# Patient Record
Sex: Female | Born: 1950 | Race: Black or African American | Hispanic: No | Marital: Married | Smoking: Never smoker
Health system: Southern US, Community
[De-identification: ages and names within clinical notes are randomized; demographics above are authoritative.]

## PROBLEM LIST (undated history)

## (undated) DIAGNOSIS — M797 Fibromyalgia: Secondary | ICD-10-CM

## (undated) DIAGNOSIS — R112 Nausea with vomiting, unspecified: Secondary | ICD-10-CM

## (undated) DIAGNOSIS — M1990 Unspecified osteoarthritis, unspecified site: Secondary | ICD-10-CM

## (undated) DIAGNOSIS — I471 Supraventricular tachycardia, unspecified: Secondary | ICD-10-CM

## (undated) DIAGNOSIS — Z8489 Family history of other specified conditions: Secondary | ICD-10-CM

## (undated) DIAGNOSIS — K219 Gastro-esophageal reflux disease without esophagitis: Secondary | ICD-10-CM

## (undated) DIAGNOSIS — I10 Essential (primary) hypertension: Secondary | ICD-10-CM

## (undated) DIAGNOSIS — C50919 Malignant neoplasm of unspecified site of unspecified female breast: Secondary | ICD-10-CM

## (undated) DIAGNOSIS — J329 Chronic sinusitis, unspecified: Secondary | ICD-10-CM

## (undated) DIAGNOSIS — R42 Dizziness and giddiness: Secondary | ICD-10-CM

## (undated) DIAGNOSIS — M810 Age-related osteoporosis without current pathological fracture: Secondary | ICD-10-CM

## (undated) DIAGNOSIS — R002 Palpitations: Secondary | ICD-10-CM

## (undated) DIAGNOSIS — G4700 Insomnia, unspecified: Secondary | ICD-10-CM

## (undated) DIAGNOSIS — T753XXA Motion sickness, initial encounter: Secondary | ICD-10-CM

## (undated) DIAGNOSIS — Z9889 Other specified postprocedural states: Secondary | ICD-10-CM

## (undated) DIAGNOSIS — I7300 Raynaud's syndrome without gangrene: Secondary | ICD-10-CM

## (undated) DIAGNOSIS — I491 Atrial premature depolarization: Secondary | ICD-10-CM

## (undated) HISTORY — PX: APPENDECTOMY: SHX54

## (undated) HISTORY — PX: BLADDER SURGERY: SHX569

## (undated) HISTORY — PX: ESOPHAGOGASTRODUODENOSCOPY: SHX1529

## (undated) HISTORY — PX: COLON SURGERY: SHX602

---

## 2012-06-05 DIAGNOSIS — E559 Vitamin D deficiency, unspecified: Secondary | ICD-10-CM | POA: Insufficient documentation

## 2012-07-20 DIAGNOSIS — I7300 Raynaud's syndrome without gangrene: Secondary | ICD-10-CM | POA: Insufficient documentation

## 2012-10-11 HISTORY — PX: MASTECTOMY, PARTIAL: SHX709

## 2012-10-15 DIAGNOSIS — C50812 Malignant neoplasm of overlapping sites of left female breast: Secondary | ICD-10-CM | POA: Insufficient documentation

## 2013-10-30 DIAGNOSIS — M7750 Other enthesopathy of unspecified foot: Secondary | ICD-10-CM | POA: Insufficient documentation

## 2013-10-30 DIAGNOSIS — M722 Plantar fascial fibromatosis: Secondary | ICD-10-CM | POA: Insufficient documentation

## 2013-10-30 DIAGNOSIS — M79673 Pain in unspecified foot: Secondary | ICD-10-CM | POA: Insufficient documentation

## 2014-05-22 DIAGNOSIS — R140 Abdominal distension (gaseous): Secondary | ICD-10-CM | POA: Insufficient documentation

## 2014-05-22 DIAGNOSIS — K219 Gastro-esophageal reflux disease without esophagitis: Secondary | ICD-10-CM | POA: Insufficient documentation

## 2014-08-09 DIAGNOSIS — H52209 Unspecified astigmatism, unspecified eye: Secondary | ICD-10-CM

## 2014-08-09 DIAGNOSIS — H524 Presbyopia: Secondary | ICD-10-CM

## 2014-08-09 DIAGNOSIS — H5200 Hypermetropia, unspecified eye: Secondary | ICD-10-CM | POA: Insufficient documentation

## 2014-08-09 DIAGNOSIS — H04123 Dry eye syndrome of bilateral lacrimal glands: Secondary | ICD-10-CM | POA: Insufficient documentation

## 2014-12-18 DIAGNOSIS — R9431 Abnormal electrocardiogram [ECG] [EKG]: Secondary | ICD-10-CM | POA: Insufficient documentation

## 2015-06-23 DIAGNOSIS — G43809 Other migraine, not intractable, without status migrainosus: Secondary | ICD-10-CM | POA: Insufficient documentation

## 2015-12-27 DIAGNOSIS — M810 Age-related osteoporosis without current pathological fracture: Secondary | ICD-10-CM | POA: Insufficient documentation

## 2016-02-12 DIAGNOSIS — M6780 Other specified disorders of synovium and tendon, unspecified site: Secondary | ICD-10-CM | POA: Insufficient documentation

## 2016-12-13 DIAGNOSIS — M6530 Trigger finger, unspecified finger: Secondary | ICD-10-CM | POA: Insufficient documentation

## 2017-08-01 DIAGNOSIS — Z9049 Acquired absence of other specified parts of digestive tract: Secondary | ICD-10-CM | POA: Insufficient documentation

## 2017-12-08 DIAGNOSIS — R232 Flushing: Secondary | ICD-10-CM | POA: Insufficient documentation

## 2017-12-08 DIAGNOSIS — Z800 Family history of malignant neoplasm of digestive organs: Secondary | ICD-10-CM | POA: Insufficient documentation

## 2017-12-08 DIAGNOSIS — Z803 Family history of malignant neoplasm of breast: Secondary | ICD-10-CM | POA: Insufficient documentation

## 2017-12-08 DIAGNOSIS — Z79810 Long term (current) use of selective estrogen receptor modulators (SERMs): Secondary | ICD-10-CM

## 2017-12-08 DIAGNOSIS — Z5181 Encounter for therapeutic drug level monitoring: Secondary | ICD-10-CM | POA: Insufficient documentation

## 2018-01-16 DIAGNOSIS — C50812 Malignant neoplasm of overlapping sites of left female breast: Secondary | ICD-10-CM | POA: Insufficient documentation

## 2018-03-02 DIAGNOSIS — Z1379 Encounter for other screening for genetic and chromosomal anomalies: Secondary | ICD-10-CM | POA: Insufficient documentation

## 2018-11-17 DIAGNOSIS — M19011 Primary osteoarthritis, right shoulder: Secondary | ICD-10-CM | POA: Insufficient documentation

## 2019-01-04 ENCOUNTER — Encounter: Payer: Self-pay | Admitting: Podiatry

## 2019-01-04 ENCOUNTER — Ambulatory Visit (INDEPENDENT_AMBULATORY_CARE_PROVIDER_SITE_OTHER): Payer: Medicare Other | Admitting: Podiatry

## 2019-01-04 ENCOUNTER — Other Ambulatory Visit: Payer: Self-pay

## 2019-01-04 VITALS — BP 113/72 | HR 69 | Resp 16

## 2019-01-04 DIAGNOSIS — G4700 Insomnia, unspecified: Secondary | ICD-10-CM | POA: Insufficient documentation

## 2019-01-04 DIAGNOSIS — R002 Palpitations: Secondary | ICD-10-CM | POA: Insufficient documentation

## 2019-01-04 DIAGNOSIS — J329 Chronic sinusitis, unspecified: Secondary | ICD-10-CM | POA: Insufficient documentation

## 2019-01-04 DIAGNOSIS — M545 Low back pain, unspecified: Secondary | ICD-10-CM | POA: Insufficient documentation

## 2019-01-04 DIAGNOSIS — L600 Ingrowing nail: Secondary | ICD-10-CM

## 2019-01-04 DIAGNOSIS — J309 Allergic rhinitis, unspecified: Secondary | ICD-10-CM | POA: Insufficient documentation

## 2019-01-04 DIAGNOSIS — K649 Unspecified hemorrhoids: Secondary | ICD-10-CM | POA: Insufficient documentation

## 2019-01-04 DIAGNOSIS — G4730 Sleep apnea, unspecified: Secondary | ICD-10-CM | POA: Insufficient documentation

## 2019-01-04 DIAGNOSIS — M818 Other osteoporosis without current pathological fracture: Secondary | ICD-10-CM | POA: Insufficient documentation

## 2019-01-04 DIAGNOSIS — Z780 Asymptomatic menopausal state: Secondary | ICD-10-CM | POA: Insufficient documentation

## 2019-01-04 MED ORDER — NEOMYCIN-POLYMYXIN-HC 1 % OT SOLN: OTIC | 1 refills | Status: DC

## 2019-01-04 NOTE — Patient Instructions (Signed)

## 2019-01-04 NOTE — Progress Notes (Signed)
Subjective:  Patient ID: Jackie Carlson, female    DOB: 1951-06-28,  MRN: [id number] HPI Chief Complaint  Patient presents with  . Toe Pain    Hallux left - medial border, tender, red, slightly swollen x weeks, tried trimming out, said had nail removed before  . Foot Pain    Suggestions for cramping in feet - tried increasing magnesium    68 y.o. female presents with the above complaint.   ROS: Denies fever chills nausea vomiting muscle aches pains calf pain back pain chest pain shortness of breath and headache.    No past medical history on file.   Current Outpatient Medications:  .  Ascorbic Acid (VITAMIN C PO), Take by mouth., Disp: , Rfl:  .  Multiple Vitamin (MULTIVITAMIN) capsule, Take 1 capsule by mouth daily., Disp: , Rfl:  .  Multiple Vitamins-Minerals (OSTEOPRIME PLUS PO), Take by mouth., Disp: , Rfl:  .  Omega-3 Fatty Acids (FISH OIL PO), Take by mouth., Disp: , Rfl:  .  risedronate (ACTONEL) 150 MG tablet, TAKE 1 TABLET EVERY 30 DAYS. TAKE WITH A FULL GLASS OF WATER. DO NOT LIE DOWN FOR THE NEXT 30 MINUTES., Disp: , Rfl:  .  VITAMIN D PO, Take by mouth., Disp: , Rfl:  .  fluticasone (FLONASE) 50 MCG/ACT nasal spray, , Disp: , Rfl:  .  gabapentin (NEURONTIN) 300 MG capsule, , Disp: , Rfl:  .  meclizine (ANTIVERT) 25 MG tablet, , Disp: , Rfl:  .  methocarbamol (ROBAXIN) 750 MG tablet, , Disp: , Rfl:  .  naproxen (NAPROSYN) 500 MG tablet, TAKE 1 TABLET BY MOUTH TWICE DAILY AS NEEDED WITH FOOD, Disp: , Rfl:  .  NEOMYCIN-POLYMYXIN-HYDROCORTISONE (CORTISPORIN) 1 % SOLN OTIC solution, Apply 1-2 drops to toe BID after soaking, Disp: 10 mL, Rfl: 1 .  olopatadine (PATANOL) 0.1 % ophthalmic solution, , Disp: , Rfl:  .  promethazine (PHENERGAN) 25 MG tablet, , Disp: , Rfl:  .  tamoxifen (NOLVADEX) 20 MG tablet, , Disp: , Rfl:  .  valACYclovir (VALTREX) 500 MG tablet, , Disp: , Rfl:  .  venlafaxine XR (EFFEXOR-XR) 37.5 MG 24 hr capsule, , Disp: , Rfl:   Allergies  Allergen Reactions   . Nalbuphine Nausea Only    Other reaction(s): Dizziness, Vomiting  . Pentazocine Nausea Only    Other reaction(s): Dizziness, Vomiting  . Sulfa Antibiotics Hives  . Codeine Nausea Only  . Tegaderm Ag Mesh [Silver] Dermatitis    Can use IV 3000 opsite dressing  . Pregabalin Swelling    Foot swelling bilaterally.   Review of Systems Objective:   Vitals:   01/04/19 1003  BP: 113/72  Pulse: 69  Resp: 16    General: Well developed, nourished, in no acute distress, alert and oriented x3   Dermatological: Skin is warm, dry and supple bilateral. Nails x 10 are well maintained; remaining integument appears unremarkable at this time. There are no open sores, no preulcerative lesions, no rash or signs of infection present.  Sharp incurvated nail margins along the tibiofibular border of the hallux left.  Mild discoloration of the nail appears to be chronic in nature more of an irritation than an actual infection.   Vascular: Dorsalis Pedis artery and Posterior Tibial artery pedal pulses are 2/4 bilateral with immedate capillary fill time. Pedal hair growth present. No varicosities and no lower extremity edema present bilateral.   Neruologic: Grossly intact via light touch bilateral. Vibratory intact via tuning fork bilateral. Protective threshold with Jobie Quaker  monofilament intact to all pedal sites bilateral. Patellar and Achilles deep tendon reflexes 2+ bilateral. No Babinski or clonus noted bilateral.   Musculoskeletal: No gross boney pedal deformities bilateral. No pain, crepitus, or limitation noted with foot and ankle range of motion bilateral. Muscular strength 5/5 in all groups tested bilateral.  Gait: Unassisted, Nonantalgic.    Radiographs:  None taken  Assessment & Plan:   Assessment: Ingrown toenail tibial and fibular border of the hallux left  Plan: Discussed etiology pathology conservative versus surgical therapies.  At this point a chemical matrixectomy was  performed to the tibiofibular border of the hallux left.  There were no complications she tolerated this well after local anesthetic was administered.  She was provided with both oral and written home-going instruction for the care and soaking of the toe as well as a prescription for Corticosporin otic to be applied twice daily after soaking.  She will follow-up with me as needed.      T. [place], [place]

## 2020-11-19 ENCOUNTER — Ambulatory Visit: Payer: Medicare Other | Admitting: Podiatry

## 2021-06-16 ENCOUNTER — Other Ambulatory Visit: Payer: Self-pay | Admitting: Physician Assistant

## 2021-06-16 DIAGNOSIS — H93A3 Pulsatile tinnitus, bilateral: Secondary | ICD-10-CM

## 2021-06-24 ENCOUNTER — Other Ambulatory Visit: Payer: Self-pay

## 2021-06-24 ENCOUNTER — Ambulatory Visit
Admission: RE | Admit: 2021-06-24 | Discharge: 2021-06-24 | Disposition: A | Discharge status: Home / Self Care | Payer: Medicare Other | Source: Ambulatory Visit | Attending: Physician Assistant | Admitting: Physician Assistant

## 2021-06-24 DIAGNOSIS — H93A3 Pulsatile tinnitus, bilateral: Secondary | ICD-10-CM | POA: Insufficient documentation

## 2021-06-24 IMAGING — MR MR MRA HEAD W/O CM
1 series · 19 of 48 positions shown · IV contrast (gadavist)
Comparison: No pertinent prior exam.

CLINICAL DATA: Pulsatile tinnitus, bilateral. Additional history
provided by scanning technologist: Patient reports "whooshing noise"
when turning head for 2 months, intermittent nausea and headaches.

EXAM:
MRI HEAD WITHOUT AND WITH CONTRAST
MRA HEAD WITHOUT CONTRAST
TECHNIQUE: Multiplanar, multi-echo pulse sequences of the brain and surrounding
structures were acquired without and with intravenous contrast.
Angiographic images of the Circle of Willis were acquired using MRA
technique without intravenous contrast.
CONTRAST:  9mL GADAVIST GADOBUTROL 1 MMOL/ML IV SOLN

[Series 9: TOF · axial · 0.5mm · 0.41mm/px · z∈[-82,+15]mm · 19 of 205 slices shown]
[im 1/205]
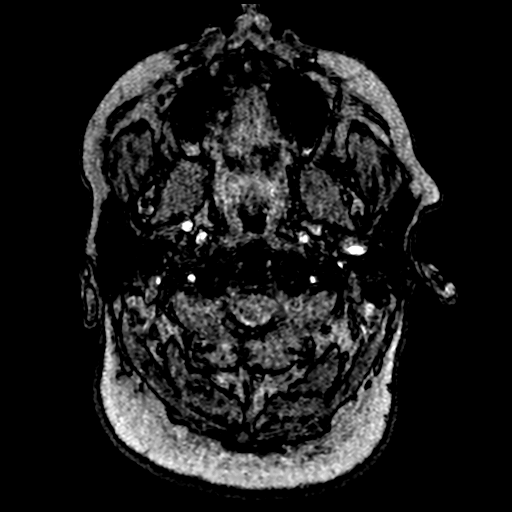
[im 5/205]
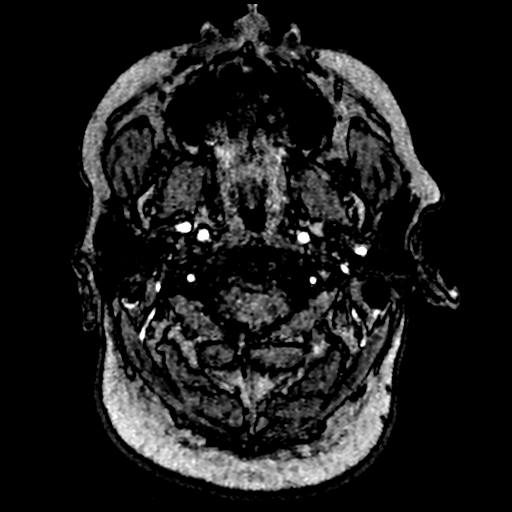
[im 9/205]
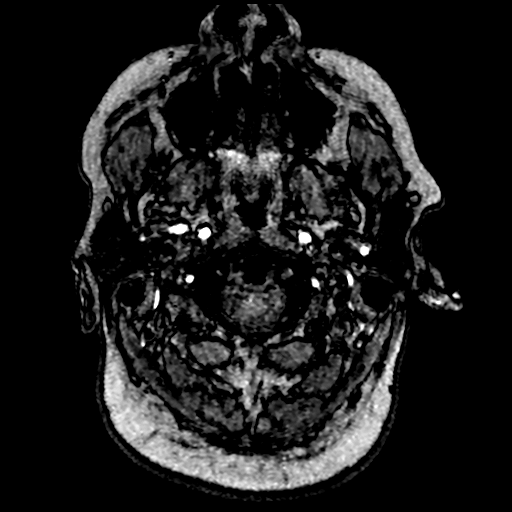
[im 14/205]
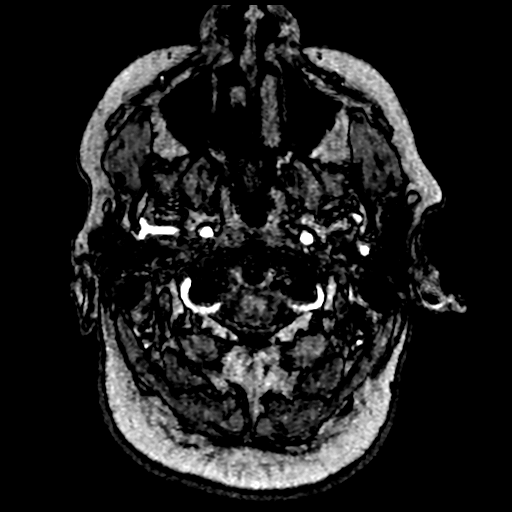
[im 18/205]
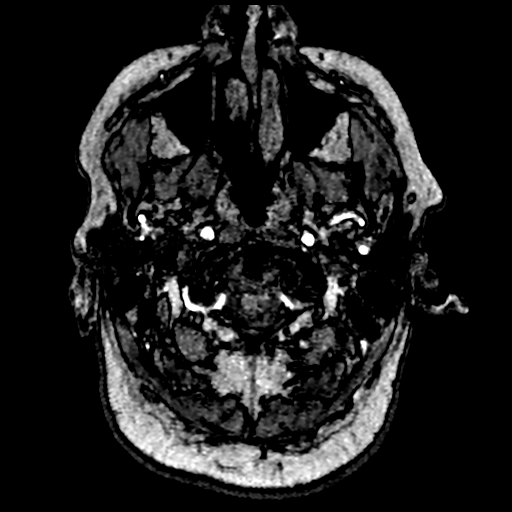
[im 22/205]
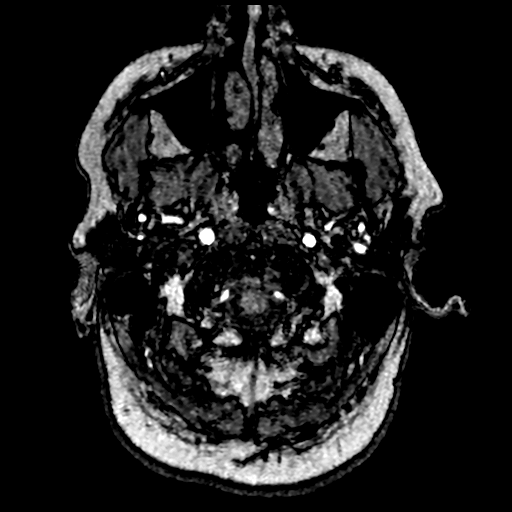
[im 27/205]
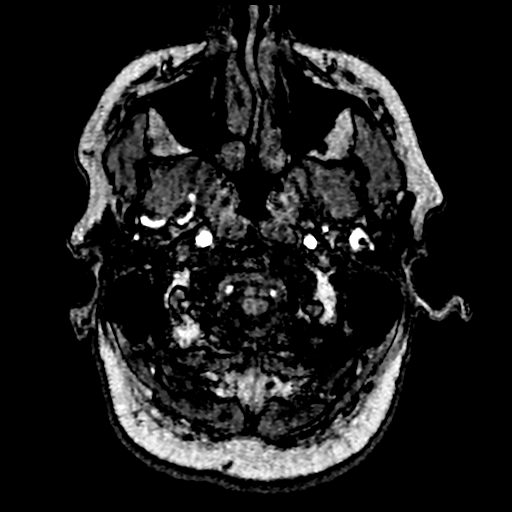
[im 31/205]
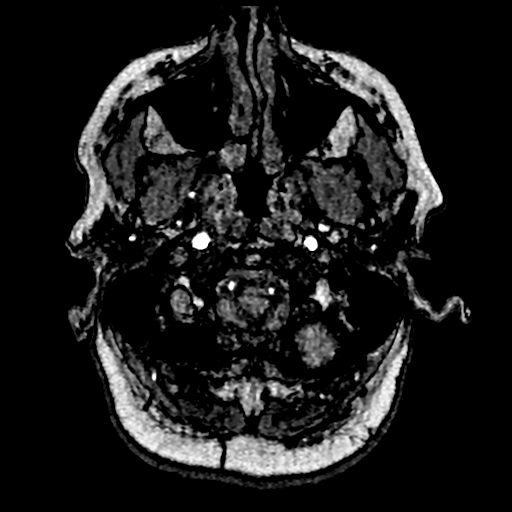
[im 35/205]
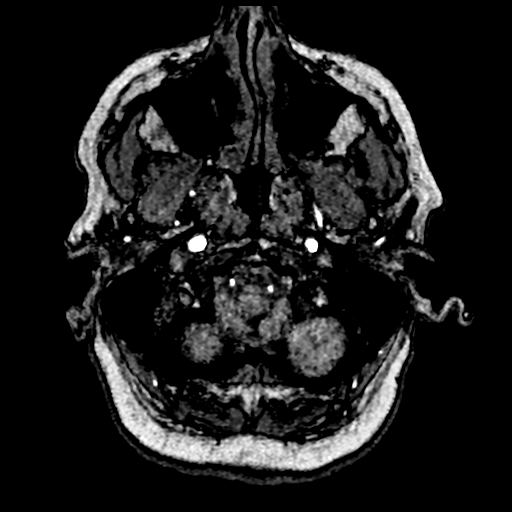
[im 40/205]
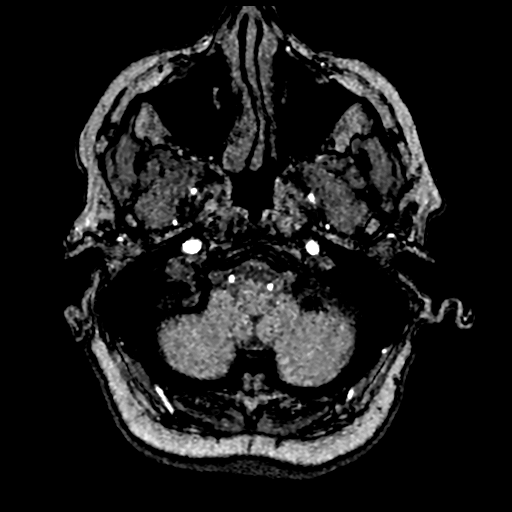
[im 44/205]
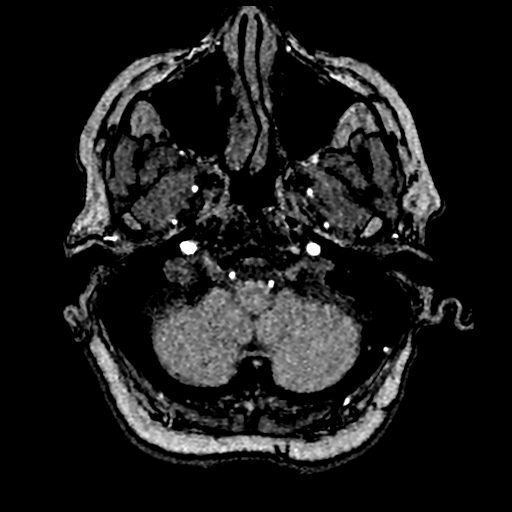
[im 66/205]
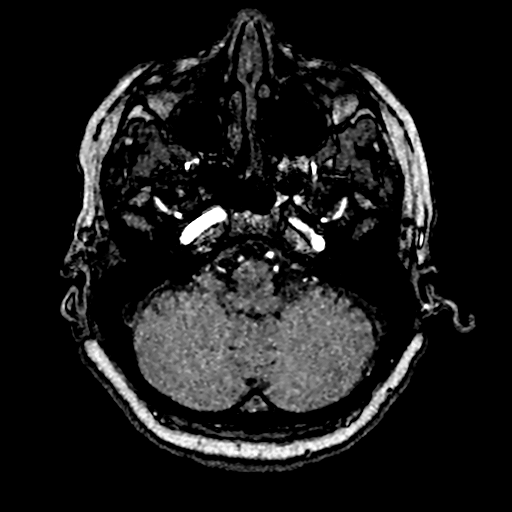
[im 92/205]
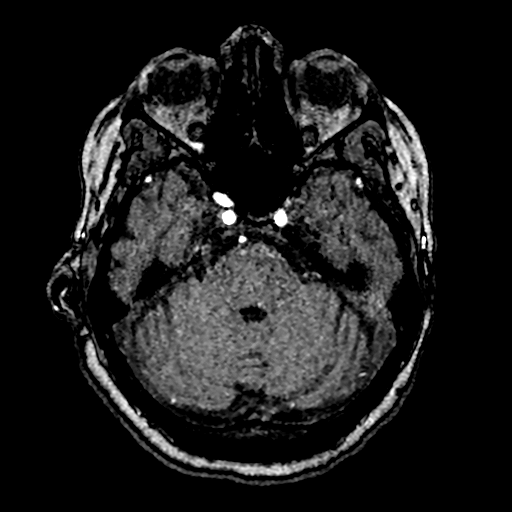
[im 105/205]
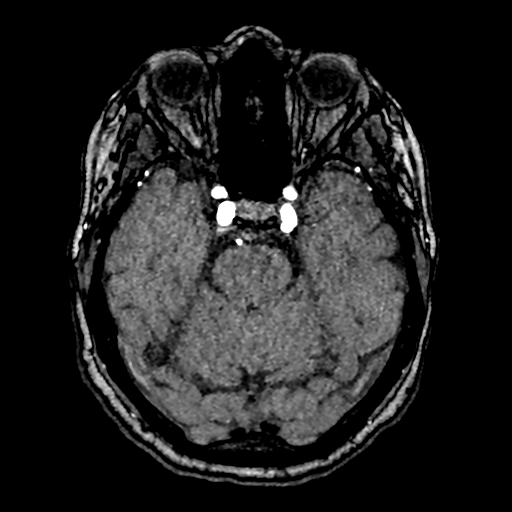
[im 118/205]
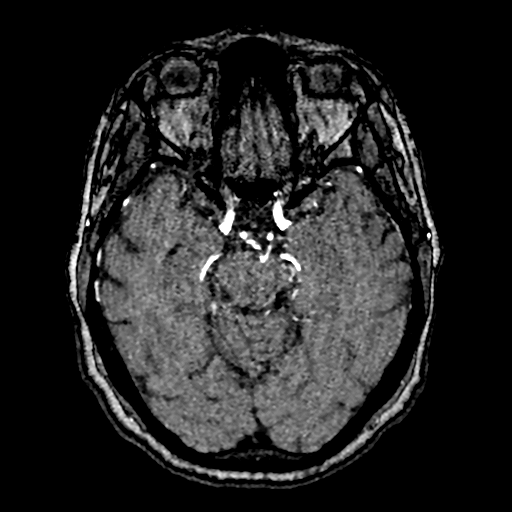
[im 144/205]
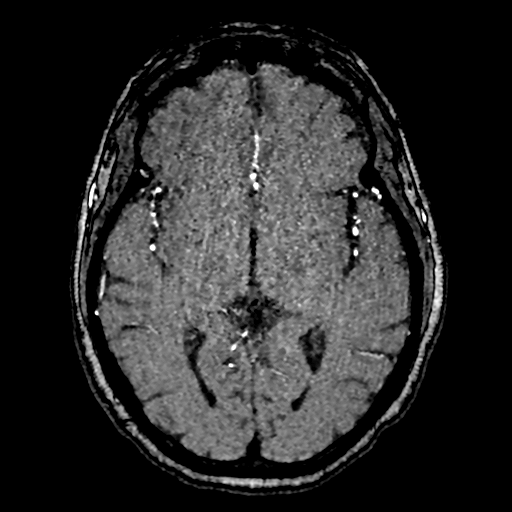
[im 170/205]
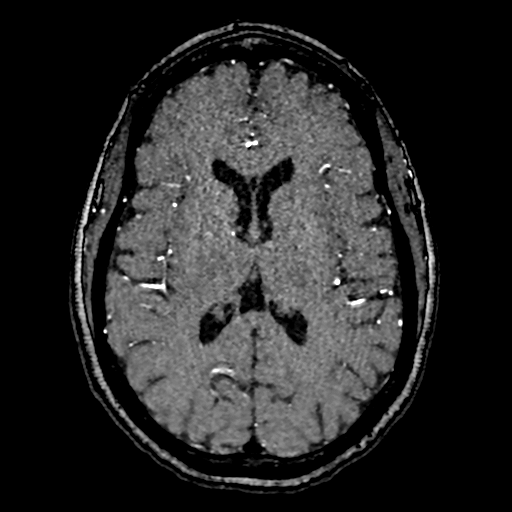
[im 174/205]
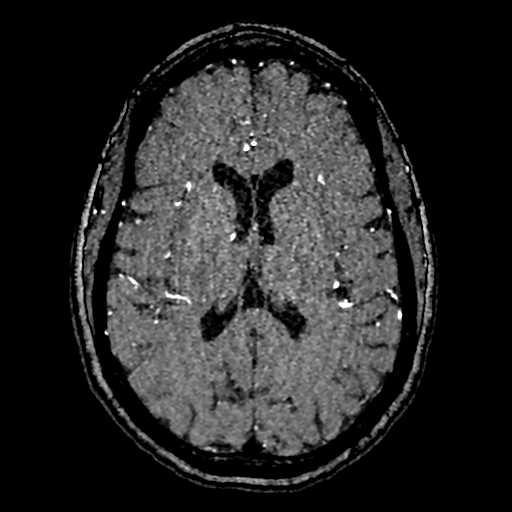
[im 196/205]
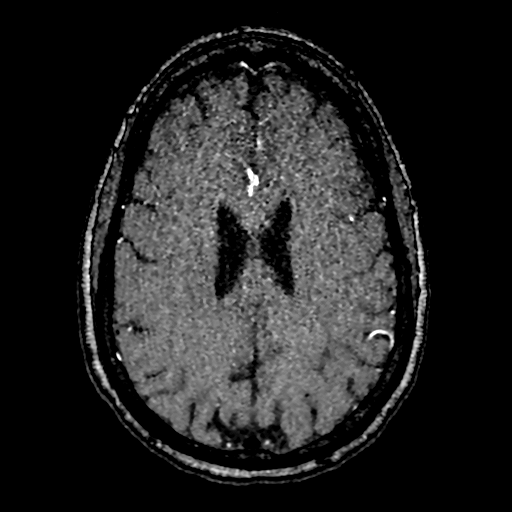

[19 of 48 positions shown; findings below may reference images not displayed]

FINDINGS: MRI HEAD FINDINGS

Brain:

Cerebral volume is normal.

No cortical encephalomalacia is identified. No significant cerebral
white matter disease for age.

There is no acute infarct.

No evidence of an intracranial mass.

No chronic intracranial blood products.

No extra-axial fluid collection.

No midline shift.

No pathologic intracranial enhancement.

Vascular: Maintained flow voids within the proximal large arterial
vessels. Developmental venous anomaly within the right internal
capsule/basal ganglia (an incidental anatomic variant).

Skull and upper cervical spine: No focal suspicious marrow lesion.

Sinuses/Orbits: Visualized orbits show no acute finding. No
significant paranasal sinus disease at the imaged levels.

MRA HEAD FINDINGS

Mildly motion degraded exam.

Anterior circulation:

The intracranial internal carotid arteries are patent. The M1 middle
cerebral arteries are patent. No M2 proximal branch occlusion or
high-grade proximal stenosis is identified. The anterior cerebral
arteries are patent. No intracranial aneurysm is identified.

Posterior circulation:

The intracranial vertebral arteries are patent. The basilar artery
is patent. The posterior cerebral arteries are patent. Posterior
communicating arteries are present bilaterally.

Anatomic variants: None significant.
IMPRESSION: MRI brain:

Unremarkable MRI appearance of the brain. No evidence of acute
intracranial abnormality.

MRA head:

1. No intracranial large vessel occlusion or proximal high-grade
arterial stenosis.
2. No intracranial aneurysm is identified.

## 2021-06-24 IMAGING — MR MR HEAD WO/W CM
14 series · 48 of 48 positions shown · IV contrast (gadavist)
Comparison: No pertinent prior exam.

CLINICAL DATA: Pulsatile tinnitus, bilateral. Additional history
provided by scanning technologist: Patient reports "whooshing noise"
when turning head for 2 months, intermittent nausea and headaches.

EXAM:
MRI HEAD WITHOUT AND WITH CONTRAST
MRA HEAD WITHOUT CONTRAST
TECHNIQUE: Multiplanar, multi-echo pulse sequences of the brain and surrounding
structures were acquired without and with intravenous contrast.
Angiographic images of the Circle of Willis were acquired using MRA
technique without intravenous contrast.
CONTRAST:  9mL GADAVIST GADOBUTROL 1 MMOL/ML IV SOLN

[Series 5: ax dwi_tracew · axial · 3.0mm · 0.65mm/px · z∈[-80,+75]mm · 3 of 48 slices shown]
[im 1/48]
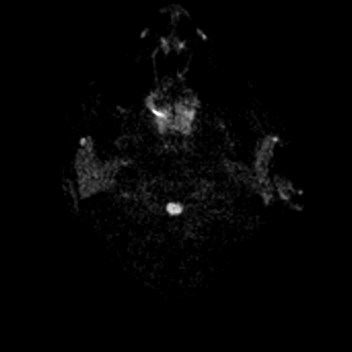
[im 24/48]
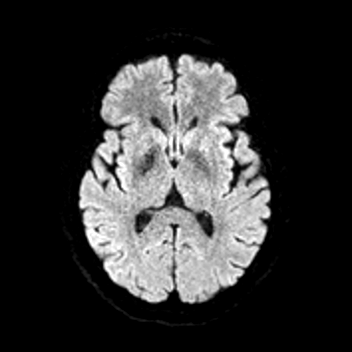
[im 48/48]
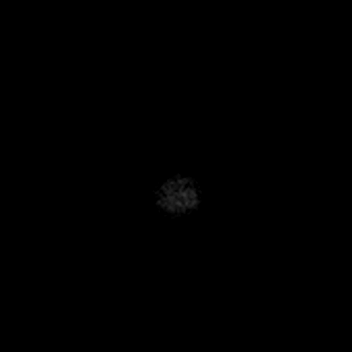

[Series 6: ax dwi_adc · axial · 3.0mm · 0.65mm/px · z∈[-80,+75]mm · 3 of 48 slices shown]
[im 1/48]
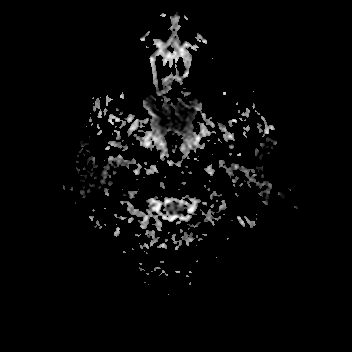
[im 24/48]
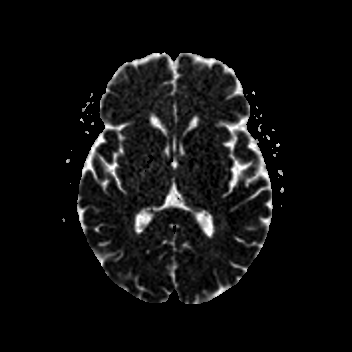
[im 48/48]
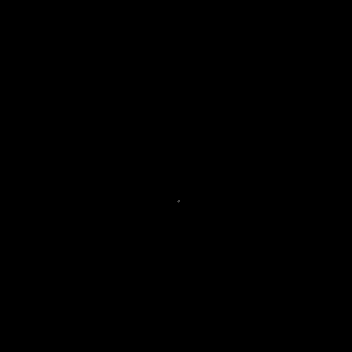

[Series 7: cor dwi_tracew · coronal · 5.0mm · 0.65mm/px · 2 of 38 slices shown]
[im 1/38]
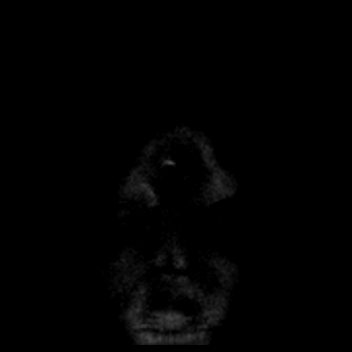
[im 38/38]
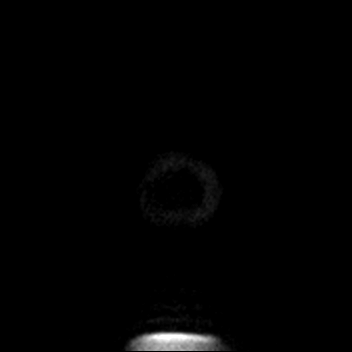

[Series 8: cor dwi_adc · coronal · 5.0mm · 0.65mm/px · 2 of 36 slices shown]
[im 1/36]
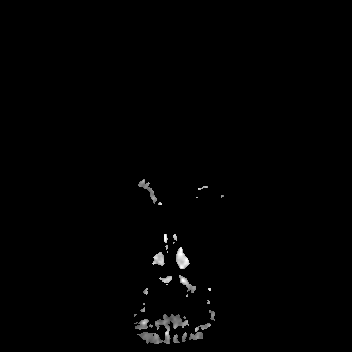
[im 36/36]
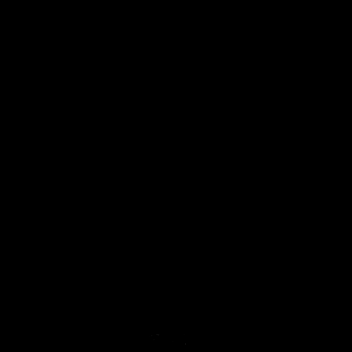

[Series 14: T1 · sagittal · 5.0mm · 0.62mm/px · 1 of 23 slices shown (1 of 2)]
[im 1/23]
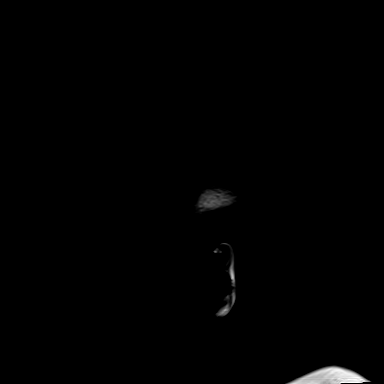

[Series 15: T2 · axial · 5.0mm · 0.53mm/px · 1 of 25 slices shown]
[im 1/25]
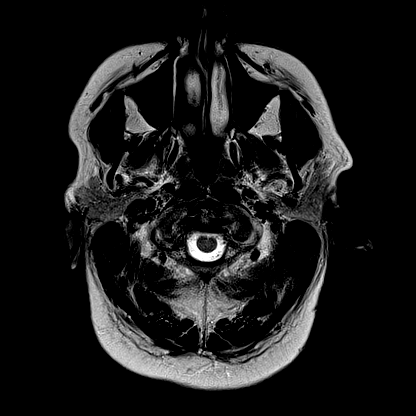

[Series 16: mag_images · axial · 3.0mm · 0.90mm/px · z∈[-91,+85]mm · 3 of 60 slices shown]
[im 1/60]
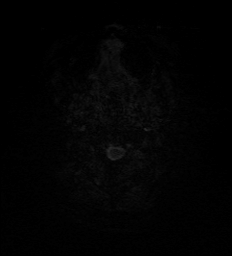
[im 30/60]
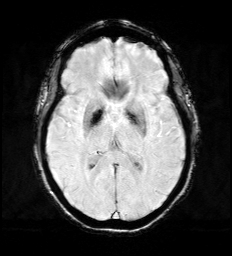
[im 60/60]
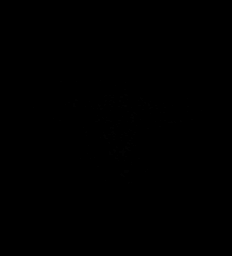

[Series 17: pha_images · axial · 3.0mm · 0.90mm/px · z∈[-91,+76]mm · 3 of 57 slices shown]
[im 1/57]
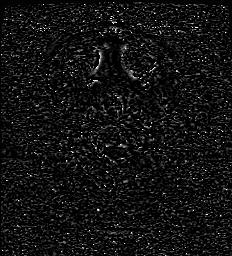
[im 29/57]
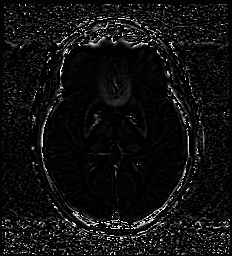
[im 57/57]
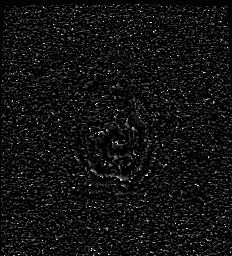

[Series 18: swi_images · axial · 3.0mm · 0.90mm/px · z∈[-91,+85]mm · 3 of 60 slices shown]
[im 1/60]
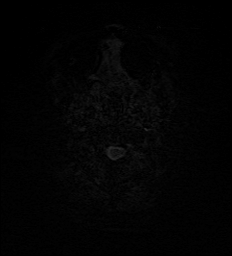
[im 30/60]
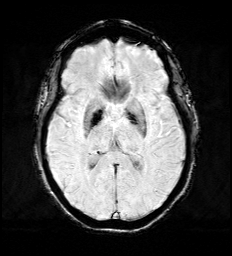
[im 60/60]
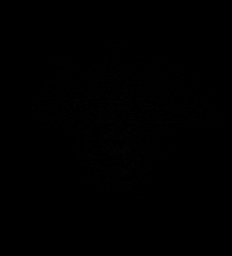

[Series 20: FLAIR · axial · 3.0mm · 0.53mm/px · z∈[-84,+78]mm · 3 of 55 slices shown]
[im 1/55]
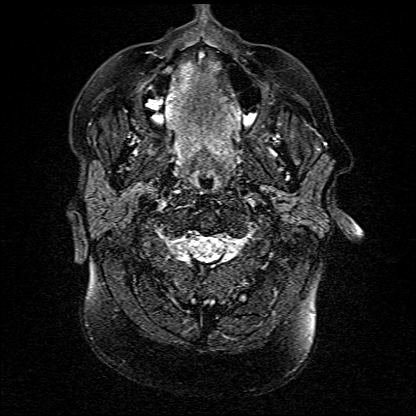
[im 28/55]
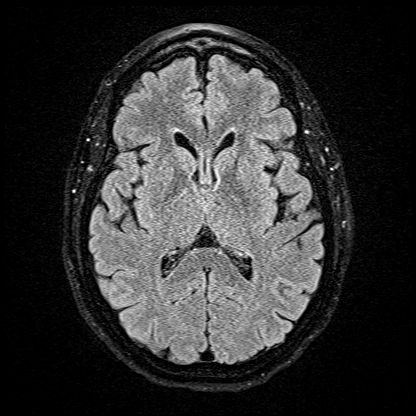
[im 55/55]
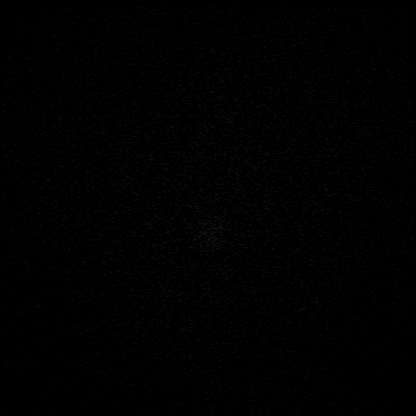

[Series 21: T1 · axial · 1.0mm · 0.98mm/px · z∈[-91,+83]mm · 10 of 173 slices shown (2 of 2)]
[im 1/173]
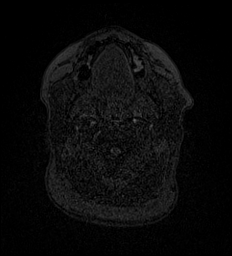
[im 20/173]
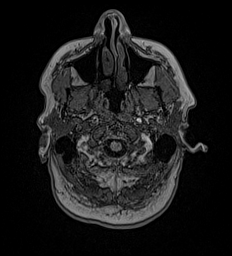
[im 39/173]
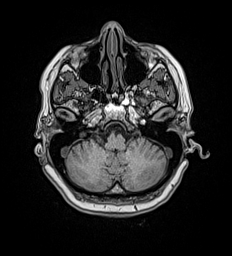
[im 58/173]
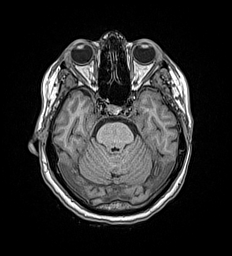
[im 77/173]
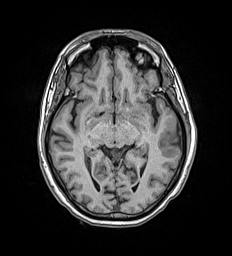
[im 96/173]
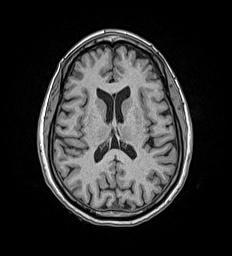
[im 115/173]
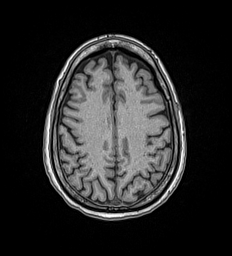
[im 134/173]
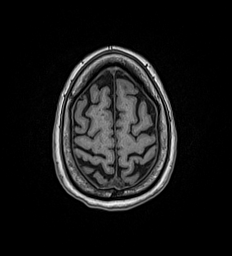
[im 153/173]
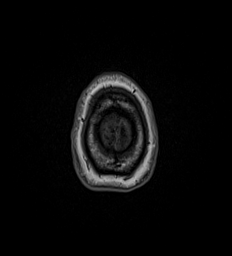
[im 173/173]
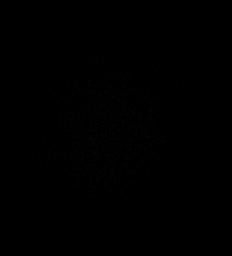

[Series 22: T2 post-contrast · coronal · 5.0mm · 0.57mm/px · 2 of 29 slices shown]
[im 1/29]
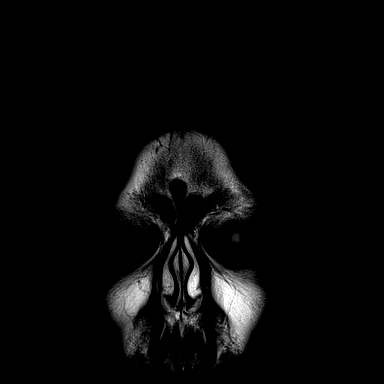
[im 29/29]
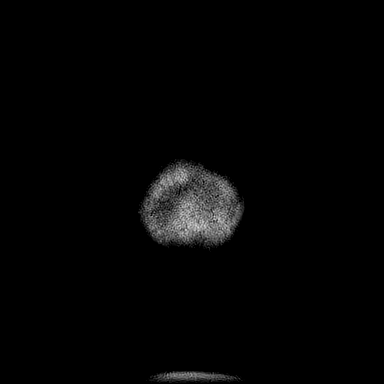

[Series 23: T1 post-contrast · axial · 1.0mm · 0.98mm/px · z∈[-91,+84]mm · 10 of 176 slices shown (1 of 2)]
[im 1/176]
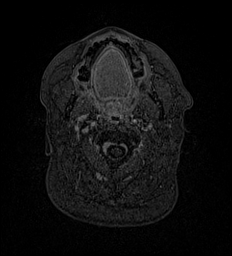
[im 20/176]
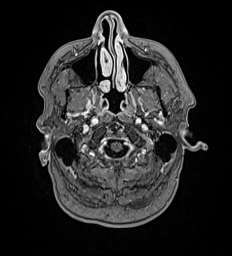
[im 39/176]
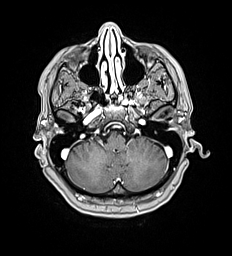
[im 59/176]
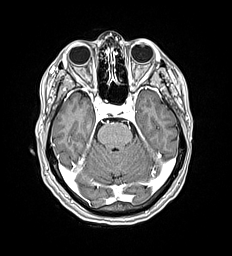
[im 78/176]
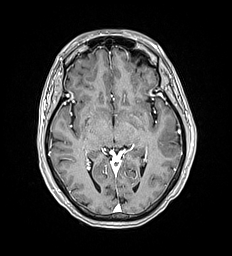
[im 98/176]
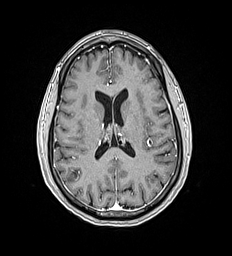
[im 117/176]
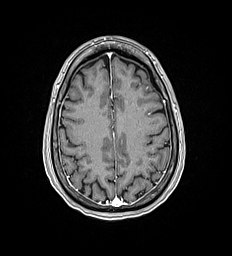
[im 137/176]
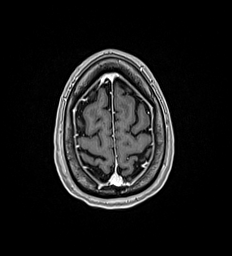
[im 156/176]
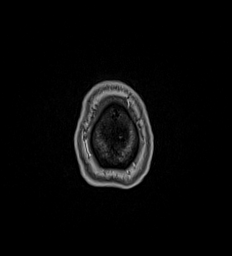
[im 176/176]
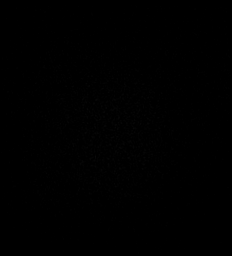

[Series 24: T1 post-contrast · coronal · 5.0mm · 0.57mm/px · 2 of 29 slices shown (2 of 2)]
[im 1/29]
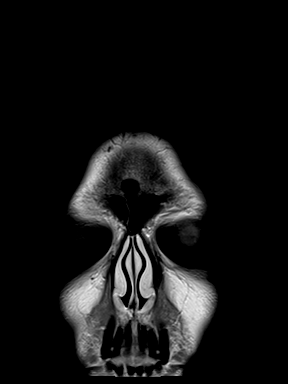
[im 29/29]
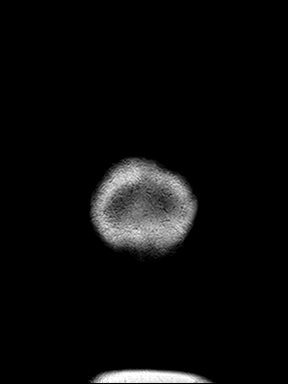

[48 of 48 positions shown; findings below may reference images not displayed]

FINDINGS: MRI HEAD FINDINGS

Brain:

Cerebral volume is normal.

No cortical encephalomalacia is identified. No significant cerebral
white matter disease for age.

There is no acute infarct.

No evidence of an intracranial mass.

No chronic intracranial blood products.

No extra-axial fluid collection.

No midline shift.

No pathologic intracranial enhancement.

Vascular: Maintained flow voids within the proximal large arterial
vessels. Developmental venous anomaly within the right internal
capsule/basal ganglia (an incidental anatomic variant).

Skull and upper cervical spine: No focal suspicious marrow lesion.

Sinuses/Orbits: Visualized orbits show no acute finding. No
significant paranasal sinus disease at the imaged levels.

MRA HEAD FINDINGS

Mildly motion degraded exam.

Anterior circulation:

The intracranial internal carotid arteries are patent. The M1 middle
cerebral arteries are patent. No M2 proximal branch occlusion or
high-grade proximal stenosis is identified. The anterior cerebral
arteries are patent. No intracranial aneurysm is identified.

Posterior circulation:

The intracranial vertebral arteries are patent. The basilar artery
is patent. The posterior cerebral arteries are patent. Posterior
communicating arteries are present bilaterally.

Anatomic variants: None significant.
IMPRESSION: MRI brain:

Unremarkable MRI appearance of the brain. No evidence of acute
intracranial abnormality.

MRA head:

1. No intracranial large vessel occlusion or proximal high-grade
arterial stenosis.
2. No intracranial aneurysm is identified.

## 2021-06-24 MED ORDER — GADOBUTROL 1 MMOL/ML IV SOLN
9.0000 mL | Freq: Once | INTRAVENOUS | Status: AC | PRN
  Administered 2021-06-24: 9 mL via INTRAVENOUS

## 2021-07-08 ENCOUNTER — Other Ambulatory Visit: Payer: Self-pay | Admitting: Otolaryngology

## 2021-07-08 DIAGNOSIS — H93A3 Pulsatile tinnitus, bilateral: Secondary | ICD-10-CM

## 2021-07-16 ENCOUNTER — Other Ambulatory Visit: Payer: Self-pay

## 2021-07-16 ENCOUNTER — Ambulatory Visit
Admission: RE | Admit: 2021-07-16 | Discharge: 2021-07-16 | Disposition: A | Discharge status: Home / Self Care | Payer: Medicare Other | Source: Ambulatory Visit | Attending: Otolaryngology | Admitting: Otolaryngology

## 2021-07-16 DIAGNOSIS — H93A3 Pulsatile tinnitus, bilateral: Secondary | ICD-10-CM | POA: Insufficient documentation

## 2021-07-16 IMAGING — US US CAROTID DUPLEX BILAT
1 series · 13 of 24 positions shown · non-contrast
Comparison: None.

CLINICAL DATA: Pulsatile tinnitus.

EXAM:
BILATERAL CAROTID DUPLEX ULTRASOUND
TECHNIQUE: Gray scale imaging, color Doppler and duplex ultrasound were
performed of bilateral carotid and vertebral arteries in the neck.

[Series 1: us carotid bilateral · 13 of 64 slices shown]
[im 1/64]
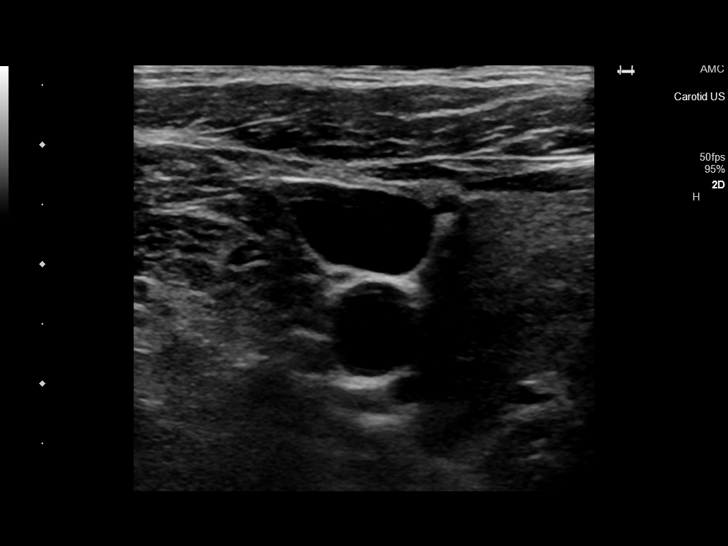
[im 6/64]
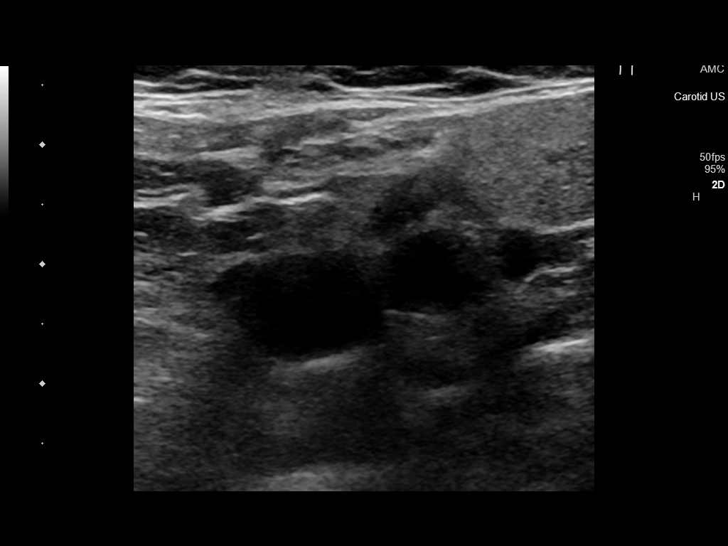
[im 11/64]
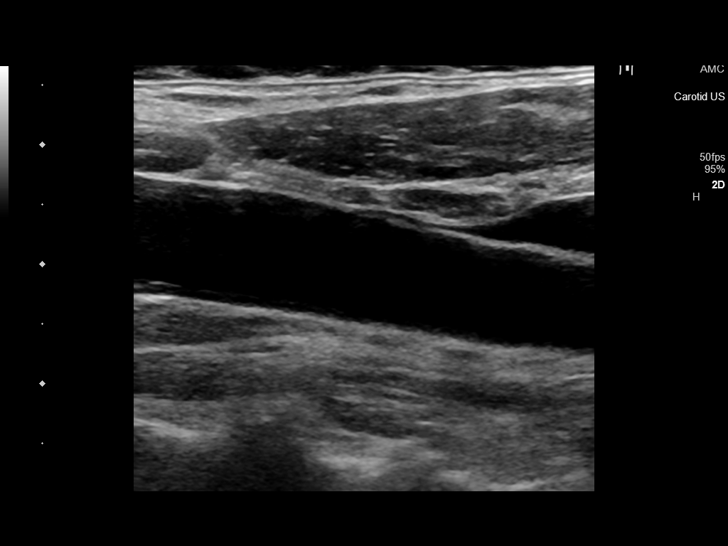
[im 17/64]
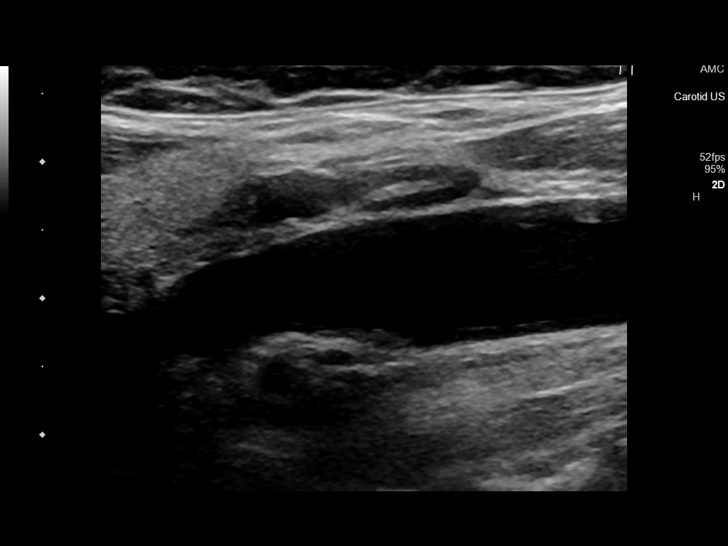
[im 22/64]
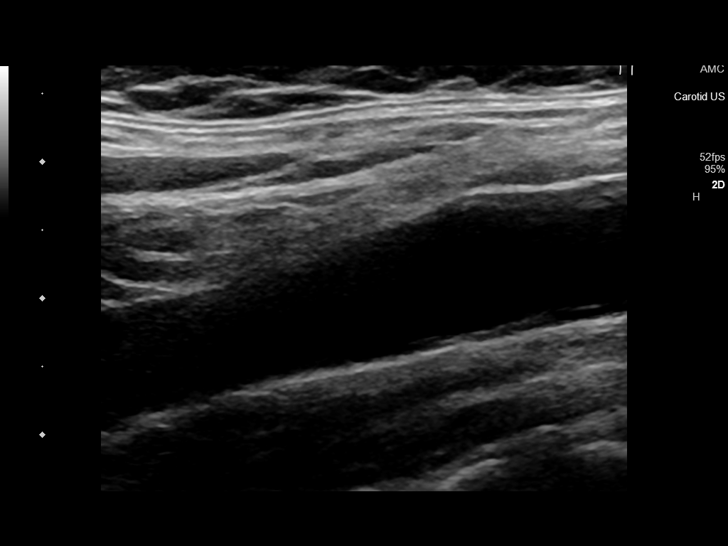
[im 28/64]
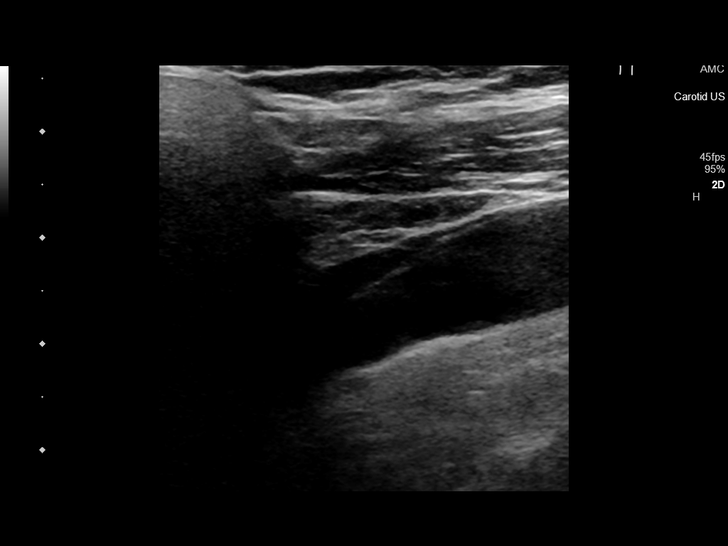
[im 33/64]
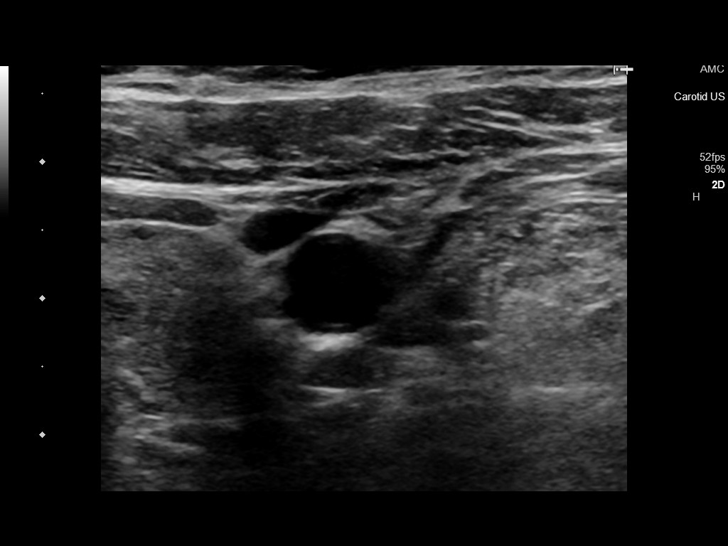
[im 36/64]
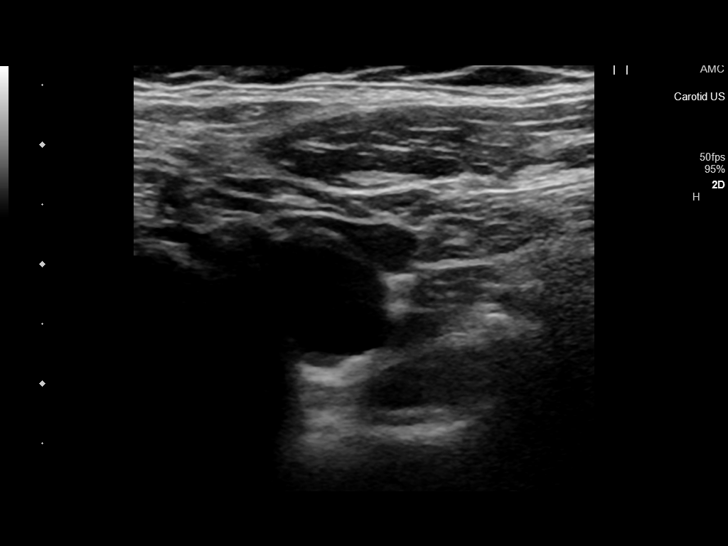
[im 42/64]
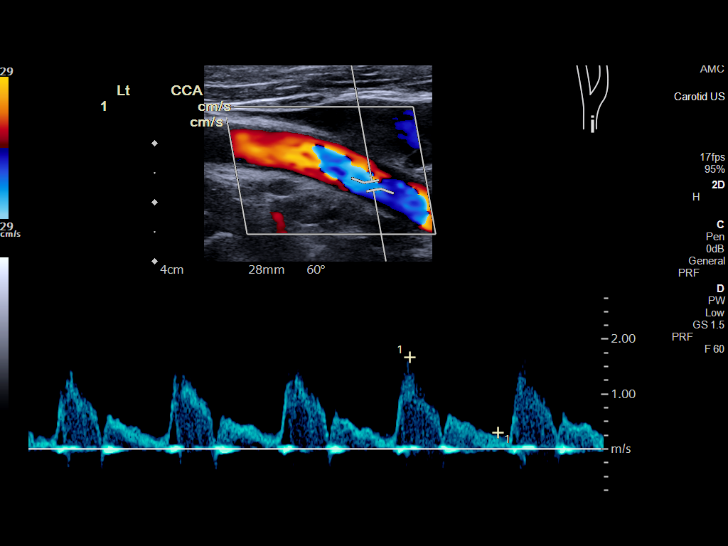
[im 47/64]
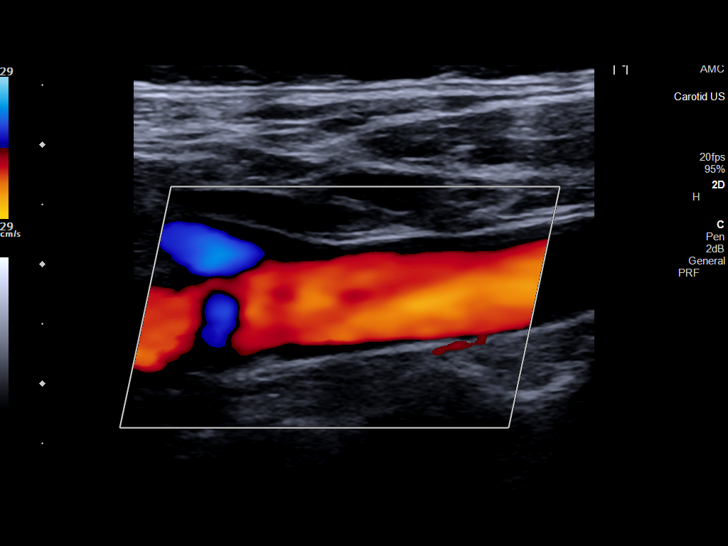
[im 53/64]
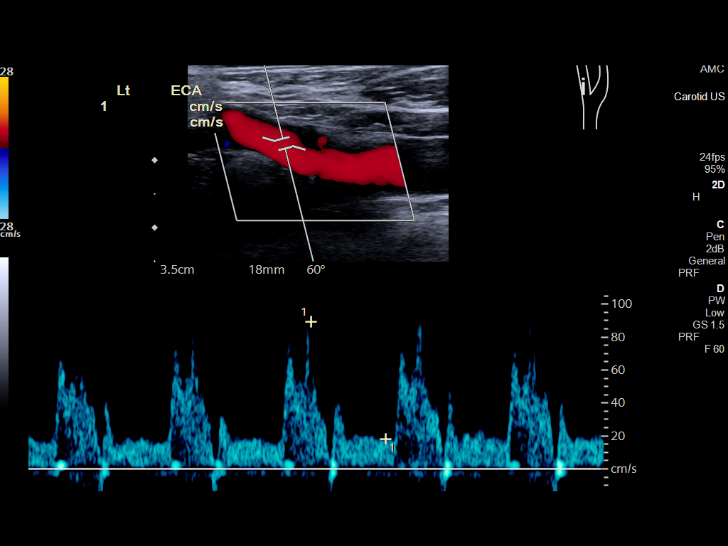
[im 58/64]
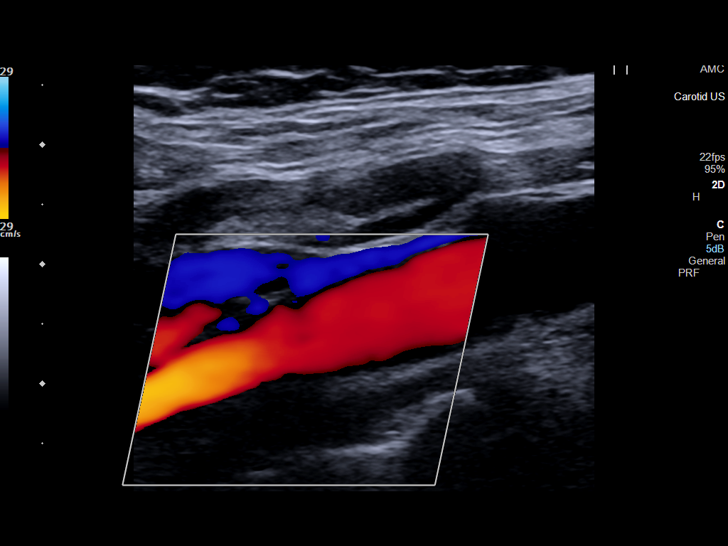
[im 64/64]
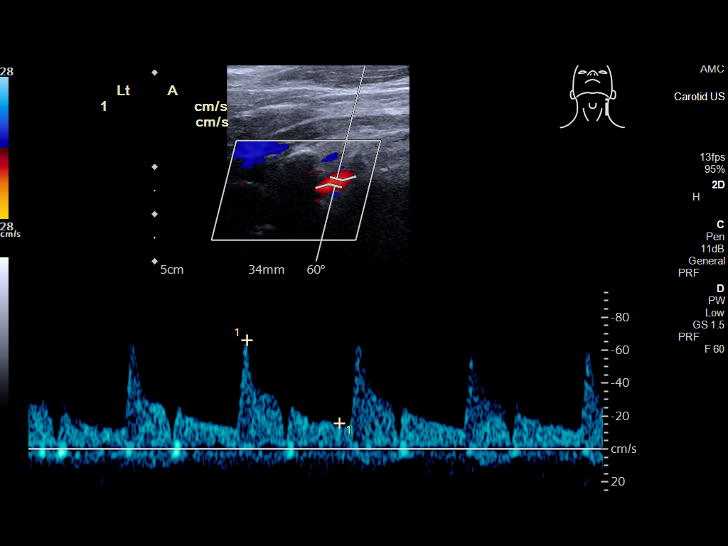

[13 of 24 positions shown; findings below may reference images not displayed]

FINDINGS: Criteria: Quantification of carotid stenosis is based on velocity
parameters that correlate the residual internal carotid diameter
with NASCET-based stenosis levels, using the diameter of the distal
internal carotid lumen as the denominator for stenosis measurement.

The following velocity measurements were obtained:

RIGHT

ICA: 101/41 cm/sec

CCA: 57/17 cm/sec

SYSTOLIC ICA/CCA RATIO:

ECA: 83 cm/sec

LEFT

ICA: 100/47 cm/sec

CCA: 77/24 cm/sec

SYSTOLIC ICA/CCA RATIO:

ECA: 89 cm/sec

RIGHT CAROTID ARTERY: There is no grayscale evidence of significant
intimal thickening or atherosclerotic plaque affecting the
interrogated portions of the right carotid system. There are no
elevated peak systolic velocities within the interrogated course of
the right internal carotid artery to suggest a hemodynamically
significant stenosis.

RIGHT VERTEBRAL ARTERY:  Antegrade flow

LEFT CAROTID ARTERY: There is a very minimal amount of eccentric
echogenic plaque involving the proximal aspect the left common
carotid artery (image 55), not resulting in elevated peak systolic
velocities within the interrogated course of the left internal
carotid artery to suggest a hemodynamically significant stenosis.

LEFT VERTEBRAL ARTERY:  Antegrade flow
IMPRESSION: 1. Minimal amount of left-sided atherosclerotic plaque, not
resulting in a hemodynamically significant stenosis.
2. Unremarkable sonographic evaluation of the right carotid system.

## 2023-10-27 ENCOUNTER — Other Ambulatory Visit: Payer: Self-pay | Admitting: Otolaryngology

## 2023-10-27 DIAGNOSIS — H9311 Tinnitus, right ear: Secondary | ICD-10-CM

## 2023-10-28 ENCOUNTER — Encounter: Payer: Self-pay | Admitting: Otolaryngology

## 2023-11-09 ENCOUNTER — Other Ambulatory Visit: Payer: Medicare Other

## 2023-11-11 ENCOUNTER — Encounter: Payer: Self-pay | Admitting: Otolaryngology

## 2023-11-14 ENCOUNTER — Other Ambulatory Visit: Payer: Medicare Other

## 2023-11-18 ENCOUNTER — Encounter: Payer: Self-pay | Admitting: Otolaryngology

## 2023-12-06 ENCOUNTER — Ambulatory Visit
Admission: RE | Admit: 2023-12-06 | Discharge: 2023-12-06 | Disposition: A | Discharge status: Home / Self Care | Payer: Medicare HMO | Source: Ambulatory Visit | Attending: Otolaryngology | Admitting: Otolaryngology

## 2023-12-06 DIAGNOSIS — H9311 Tinnitus, right ear: Secondary | ICD-10-CM

## 2023-12-06 MED ORDER — GADOPICLENOL 0.5 MMOL/ML IV SOLN
10.0000 mL | Freq: Once | INTRAVENOUS | Status: AC | PRN
  Administered 2023-12-06: 10 mL via INTRAVENOUS

## 2023-12-29 ENCOUNTER — Other Ambulatory Visit: Payer: Self-pay | Admitting: Otolaryngology

## 2024-01-03 ENCOUNTER — Encounter: Payer: Self-pay | Admitting: Otolaryngology

## 2024-01-10 ENCOUNTER — Other Ambulatory Visit: Payer: Self-pay

## 2024-01-10 MED ORDER — CIPROFLOXACIN-DEXAMETHASONE 0.3-0.1 % OT SUSP
4.0000 [drp] | Freq: Two times a day (BID) | OTIC | 0 refills | Status: AC
  Filled 2024-01-10: qty 7.5, 7d supply, fill #0

## 2024-01-11 ENCOUNTER — Other Ambulatory Visit: Payer: Self-pay

## 2024-01-16 ENCOUNTER — Other Ambulatory Visit: Payer: Self-pay

## 2024-01-16 NOTE — Discharge Instructions (Signed)
 [facility] DISCHARGE INSTRUCTIONS FOR MYRINGOTOMY AND TUBE INSERTION   [facility] Bud Face, M.D.   Diet:   After surgery, the patient should take only liquids and foods as tolerated.  The patient may then have a regular diet after the effects of anesthesia have worn off, usually about four to six hours after surgery.  Activities:   The patient should rest until the effects of anesthesia have worn off.  After this, there are no restrictions on the normal daily activities.  Medications:   You will be given a prescription for antibiotic drops to be used in the ears postoperatively.  It is recommended to use 4 drops 2 times a day for 4 days, then the drops should be saved for possible future use.  The tubes should not cause any discomfort to the patient, but if there is any question, Tylenol should be given according to the instructions for the age of the patient.  Other medications should be continued normally.  Precautions:   Should there be recurrent drainage after the tubes are placed, the drops should be used for approximately 3-4 days.  If it does not clear, you should call the ENT office.  Earplugs:   Earplugs are only needed for those who are going to be submerged under water.  When taking a bath or shower and using a cup or showerhead to rinse hair, it is not necessary to wear earplugs.  These come in a variety of fashions, all of which can be obtained at our office.  However, if one is not able to come by the office, then silicone plugs can be found at most pharmacies.  It is not advised to stick anything in the ear that is not approved as an earplug.  Silly putty is not to be used as an earplug.  Swimming is allowed in patients after ear tubes are inserted, however, they must wear earplugs if they are going to be submerged under water.  For those children who are going to be swimming a lot, it is recommended to use a fitted ear mold, which can be  made by our audiologist.  If discharge is noticed from the ears, this most likely represents an ear infection.  We would recommend getting your eardrops and using them as indicated above.  If it does not clear, then you should call the ENT office.  For follow up, the patient should return to the ENT office three weeks postoperatively and then every six months as required by the doctor.

## 2024-01-18 ENCOUNTER — Ambulatory Visit
Admission: RE | Admit: 2024-01-18 | Discharge: 2024-01-18 | Disposition: A | Discharge status: Home / Self Care | Attending: Otolaryngology | Admitting: Otolaryngology

## 2024-01-18 ENCOUNTER — Ambulatory Visit: Payer: Self-pay | Admitting: Anesthesiology

## 2024-01-18 ENCOUNTER — Encounter: Payer: Self-pay | Admitting: Otolaryngology

## 2024-01-18 ENCOUNTER — Encounter
Admission: RE | Disposition: A | Discharge status: Home / Self Care | Payer: Self-pay | Source: Home / Self Care | Attending: Otolaryngology

## 2024-01-18 ENCOUNTER — Other Ambulatory Visit: Payer: Self-pay

## 2024-01-18 DIAGNOSIS — I10 Essential (primary) hypertension: Secondary | ICD-10-CM | POA: Insufficient documentation

## 2024-01-18 DIAGNOSIS — H6983 Other specified disorders of Eustachian tube, bilateral: Secondary | ICD-10-CM | POA: Diagnosis present

## 2024-01-18 DIAGNOSIS — G4730 Sleep apnea, unspecified: Secondary | ICD-10-CM | POA: Insufficient documentation

## 2024-01-18 HISTORY — DX: Unspecified osteoarthritis, unspecified site: M19.90

## 2024-01-18 HISTORY — DX: Motion sickness, initial encounter: T75.3XXA

## 2024-01-18 HISTORY — DX: Palpitations: R00.2

## 2024-01-18 HISTORY — DX: Chronic sinusitis, unspecified: J32.9

## 2024-01-18 HISTORY — DX: Insomnia, unspecified: G47.00

## 2024-01-18 HISTORY — DX: Raynaud's syndrome without gangrene: I73.00

## 2024-01-18 HISTORY — DX: Atrial premature depolarization: I49.1

## 2024-01-18 HISTORY — DX: Age-related osteoporosis without current pathological fracture: M81.0

## 2024-01-18 HISTORY — DX: Gastro-esophageal reflux disease without esophagitis: K21.9

## 2024-01-18 HISTORY — DX: Other specified postprocedural states: Z98.890

## 2024-01-18 HISTORY — DX: Fibromyalgia: M79.7

## 2024-01-18 HISTORY — DX: Essential (primary) hypertension: I10

## 2024-01-18 HISTORY — DX: Dizziness and giddiness: R42

## 2024-01-18 HISTORY — PX: MYRINGOTOMY WITH TUBE PLACEMENT: SHX5663

## 2024-01-18 HISTORY — DX: Family history of other specified conditions: Z84.89

## 2024-01-18 HISTORY — DX: Malignant neoplasm of unspecified site of unspecified female breast: C50.919

## 2024-01-18 HISTORY — DX: Supraventricular tachycardia, unspecified: I47.10

## 2024-01-18 HISTORY — DX: Nausea with vomiting, unspecified: R11.2

## 2024-01-18 SURGERY — MYRINGOTOMY WITH TUBE PLACEMENT
Anesthesia: General | Laterality: Bilateral

## 2024-01-18 MED ORDER — ONDANSETRON HCL 4 MG/2ML IJ SOLN
INTRAMUSCULAR | Status: DC | PRN
  Administered 2024-01-18: 4 mg via INTRAVENOUS

## 2024-01-18 MED ORDER — LIDOCAINE HCL (CARDIAC) PF 100 MG/5ML IV SOSY
PREFILLED_SYRINGE | INTRAVENOUS | Status: DC | PRN
  Administered 2024-01-18: 100 mg via INTRAVENOUS

## 2024-01-18 MED ORDER — FENTANYL CITRATE (PF) 100 MCG/2ML IJ SOLN
INTRAMUSCULAR | Status: DC | PRN
  Administered 2024-01-18: 50 ug via INTRAVENOUS
  Administered 2024-01-18: 25 ug via INTRAVENOUS

## 2024-01-18 MED ORDER — PROPOFOL 500 MG/50ML IV EMUL
INTRAVENOUS | Status: DC | PRN
  Administered 2024-01-18: 120 ug/kg/min via INTRAVENOUS

## 2024-01-18 MED ORDER — PROPOFOL 10 MG/ML IV BOLUS
INTRAVENOUS | Status: AC
  Filled 2024-01-18: qty 40

## 2024-01-18 MED ORDER — PROPOFOL 10 MG/ML IV BOLUS
INTRAVENOUS | Status: AC
  Filled 2024-01-18: qty 20

## 2024-01-18 MED ORDER — DEXAMETHASONE SODIUM PHOSPHATE 4 MG/ML IJ SOLN
INTRAMUSCULAR | Status: DC | PRN
  Administered 2024-01-18: 4 mg via INTRAVENOUS

## 2024-01-18 MED ORDER — CIPROFLOXACIN-DEXAMETHASONE 0.3-0.1 % OT SUSP
OTIC | Status: DC | PRN
  Administered 2024-01-18: 4 [drp] via OTIC

## 2024-01-18 MED ORDER — FENTANYL CITRATE (PF) 100 MCG/2ML IJ SOLN
INTRAMUSCULAR | Status: AC
  Filled 2024-01-18: qty 2

## 2024-01-18 MED ORDER — CIPROFLOXACIN-DEXAMETHASONE 0.3-0.1 % OT SUSP: 4.0000 [drp] | Freq: Two times a day (BID) | OTIC | Status: DC

## 2024-01-18 MED ORDER — SODIUM CHLORIDE 0.9 % IV SOLN: INTRAVENOUS | Status: DC | PRN

## 2024-01-18 MED ORDER — SUCCINYLCHOLINE CHLORIDE 200 MG/10ML IV SOSY
PREFILLED_SYRINGE | INTRAVENOUS | Status: DC | PRN
  Administered 2024-01-18: 120 mg via INTRAVENOUS

## 2024-01-18 MED ORDER — ONDANSETRON HCL 4 MG PO TABS: 4.0000 mg | ORAL_TABLET | Freq: Three times a day (TID) | ORAL | 0 refills | Status: AC | PRN

## 2024-01-18 MED ORDER — PROPOFOL 10 MG/ML IV BOLUS
INTRAVENOUS | Status: DC | PRN
  Administered 2024-01-18: 50 mg via INTRAVENOUS
  Administered 2024-01-18: 200 mg via INTRAVENOUS
  Administered 2024-01-18 (×2): 50 mg via INTRAVENOUS

## 2024-01-18 SURGICAL SUPPLY — 8 items
BLADE MYR LANCE NRW W/HDL (BLADE) ×1 IMPLANT
CANISTER SUCT 1200ML W/VALVE (MISCELLANEOUS) ×1 IMPLANT
COTTONBALL LRG STERILE PKG (GAUZE/BANDAGES/DRESSINGS) ×1 IMPLANT
GLOVE SURG GAMMEX PI TX LF 7.5 (GLOVE) ×1 IMPLANT
STRAP BODY AND KNEE 60X3 (MISCELLANEOUS) ×1 IMPLANT
TOWEL OR 17X26 4PK STRL BLUE (TOWEL DISPOSABLE) ×1 IMPLANT
TUBE GRMT FLRPLST BEV 1.14 (OTOLOGIC RELATED) ×1 IMPLANT
TUBING SUCTION CONN 0.25 STRL (TUBING) ×1 IMPLANT

## 2024-01-18 NOTE — Anesthesia Preprocedure Evaluation (Addendum)
 Anesthesia Evaluation  Patient identified by MRN, date of birth, ID band Patient awake    Reviewed: Allergy & Precautions, H&P , NPO status , Patient's Chart, lab work & pertinent test results  History of Anesthesia Complications (+) PONV, Family history of anesthesia reaction and history of anesthetic complications  Airway Mallampati: III  TM Distance: >3 FB Neck ROM: Full    Dental no notable dental hx.    Pulmonary neg pulmonary ROS, sleep apnea    Pulmonary exam normal breath sounds clear to auscultation       Cardiovascular hypertension, Normal cardiovascular exam Rhythm:Regular Rate:Normal  Gae Gallop, MD - 01/10/2024  Formatting of this note might be different from the original.  *The predominant rhythm was Sinus. *The Maximum Heart Rate recorded was 150 bpm, 03/15 15:41:02, the Minimum Heart Rate recorded was 55 bpm, 03/18 04:04:35, and the Average Heart Rate was 80 bpm. *There were 22 VE beats with a burden of <1 %. *There were   9,063 SVE beats with a burden of 2 %. There were 397 occurrences of Supraventricular Tachycardia with the Fastest episode 150 bpm, 03/15 15:41:01, and the Longest episode 10.9s, 03/16 01:37:21. *There were 7 Patient Triggers.  IMPRESSION:  Predominant rhythm was Sinus. No atrial fibrillation / flutter. No pauses. Average HR 80 bpm. Minimum HR 55 bpm, 03/18 04:04:35; Maximum HR 150 bpm, 03/15 15:41:02. 22 VE beats with a burden of <1%. 9,063 SVE beats with a burden of 2%.  397 occurrences  of Supraventricular Tachycardia. Of these:      The Fastest episode 150 bpm for 5 beats, 03/15 15:41:01.      The Longest episode 10.9s at 121 bpm, 03/16 01:37:21.  7 Patient Triggers. Of these:      1 x Flutter / Skipped Beats: one sinus tach, with PAC,   3 sinus rhythm.      3 without reported symptoms: 2 sinus with SVT and PACs, 1 sinus with PAC.     Neuro/Psych  Headaches  Neuromuscular disease  negative neurological ROS  negative psych ROS   GI/Hepatic negative GI ROS, Neg liver ROS,GERD  ,,  Endo/Other  negative endocrine ROS    Renal/GU negative Renal ROS  negative genitourinary   Musculoskeletal negative musculoskeletal ROS (+) Arthritis ,  Fibromyalgia -  Abdominal   Peds negative pediatric ROS (+)  Hematology negative hematology ROS (+)   Anesthesia Other Findings Arthritis  Breast cancer  Sinusitis  Fibromyalgia GERD (gastroesophageal reflux disease) HTN (hypertension) Insomnia  Osteoporosis PONV (postoperative nausea and vomiting) Raynaud disease Family history of adverse reaction to anesthesia, son has PONV Palpitations Vertigo  Severe GERD Motion sickness Hx prolonged QT syndrome--but patient states she has safely had zofran in the past  Reproductive/Obstetrics negative OB ROS                             Anesthesia Physical Anesthesia Plan  ASA: 2  Anesthesia Plan: General ETT   Post-op Pain Management:    Induction: Intravenous, Rapid sequence and Cricoid pressure planned  PONV Risk Score and Plan:   Airway Management Planned: Oral ETT  Additional Equipment:   Intra-op Plan:   Post-operative Plan: Extubation in OR  Informed Consent: I have reviewed the patients History and Physical, chart, labs and discussed the procedure including the risks, benefits and alternatives for the proposed anesthesia with the patient or authorized representative who has indicated his/her understanding and acceptance.  Dental Advisory Given  Plan Discussed with: Anesthesiologist, CRNA and Surgeon  Anesthesia Plan Comments: (Patient consented for risks of anesthesia including but not limited to:  - adverse reactions to medications - damage to eyes, teeth, lips or other oral mucosa - nerve damage due to positioning  - sore throat or hoarseness - Damage to heart, brain, nerves, lungs, other parts of body or loss of  life  Patient voiced understanding and assent.)        Anesthesia Quick Evaluation

## 2024-01-18 NOTE — Anesthesia Postprocedure Evaluation (Signed)
 Anesthesia Post Note  Patient: Jackie Carlson  Procedure(s) Performed: MYRINGOTOMY WITH TUBE PLACEMENT (Bilateral)  Patient location during evaluation: PACU Anesthesia Type: General Level of consciousness: awake and alert Pain management: pain level controlled Vital Signs Assessment: post-procedure vital signs reviewed and stable Respiratory status: spontaneous breathing, nonlabored ventilation, respiratory function stable and patient connected to nasal cannula oxygen Cardiovascular status: blood pressure returned to baseline and stable Postop Assessment: no apparent nausea or vomiting Anesthetic complications: no   No notable events documented.   Last Vitals:  Vitals:   01/18/24 0945 01/18/24 1001  BP: 115/70 112/88  Pulse: 84 82  Resp: 17 20  Temp:  36.7 C  SpO2: 100% 98%    Last Pain:  Vitals:   01/18/24 1001  PainSc: 0-No pain                 Amberlea Spagnuolo C Ashana Tullo

## 2024-01-18 NOTE — Op Note (Signed)
..  01/18/2024  9:30 AM    Jackie Carlson  [id number]   Pre-Op Dx:  Other specified disorders of eustachian tube, bilateral [H69.83]  Post-op Dx: Other specified disorders of eustachian tube, bilateral [H69.83]  Proc:Bilateral myringotomy with tubes  Surg: [name]  Anes:  General by mask  EBL:  None  Comp:  None  Findings:  Bilateral tubes placed anterior inferiorly for ETD type symptoms  Procedure: With the patient in a comfortable supine position, general mask anesthesia was administered.  At an appropriate level, microscope and speculum were used to examine and clean the RIGHT ear canal.  The findings were as described above.  An anterior inferior radial myringotomy incision was sharply executed.  Middle ear contents were suctioned clear with a size 5 otologic suction.  A PE tube was placed without difficulty using a Rosen pick and Facilities manager.  Ciprodex otic solution was instilled into the external canal, and insufflated into the middle ear.  A cotton Krotz was placed at the external meatus. Hemostasis was observed.  This side was completed.  After completing the RIGHT side, the LEFT side was done in identical fashion.    Following this  The patient was returned to anesthesia, awakened, and transferred to recovery in stable condition.  Dispo:  PACU to home  Plan: Routine drop use and water precautions.  Recheck my office three weeks.   [name] 9:30 AM 01/18/2024

## 2024-01-18 NOTE — Anesthesia Procedure Notes (Signed)
 Procedure Name: Intubation Date/Time: 01/18/2024 9:17 AM  Performed by: Rodney Booze, CRNAPre-anesthesia Checklist: Patient identified, Emergency Drugs available, Suction available and Patient being monitored Patient Re-evaluated:Patient Re-evaluated prior to induction Oxygen Delivery Method: Circle system utilized Preoxygenation: Pre-oxygenation with 100% oxygen Induction Type: IV induction Ventilation: Mask ventilation without difficulty Laryngoscope Size: McGrath and 3 Grade View: Grade III Tube type: Oral Tube size: 7.0 mm Number of attempts: 1 Airway Equipment and Method: Stylet and Oral airway Placement Confirmation: ETT inserted through vocal cords under direct vision, positive ETCO2 and breath sounds checked- equal and bilateral Secured at: 21 cm Tube secured with: Tape Dental Injury: Teeth and Oropharynx as per pre-operative assessment

## 2024-01-18 NOTE — H&P (Signed)
 ..  History and Physical paper copy reviewed and updated date of procedure and will be scanned into system.  Patient seen and examined.

## 2024-01-18 NOTE — Transfer of Care (Signed)
 Immediate Anesthesia Transfer of Care Note  Patient: Jackie Carlson  Procedure(s) Performed: MYRINGOTOMY WITH TUBE PLACEMENT (Bilateral)  Patient Location: PACU  Anesthesia Type: General ETT  Level of Consciousness: awake, alert  and patient cooperative  Airway and Oxygen Therapy: Patient Spontanous Breathing and Patient connected to supplemental oxygen  Post-op Assessment: Post-op Vital signs reviewed, Patient's Cardiovascular Status Stable, Respiratory Function Stable, Patent Airway and No signs of Nausea or vomiting  Post-op Vital Signs: Reviewed and stable  Complications: No notable events documented.

## 2024-02-01 ENCOUNTER — Other Ambulatory Visit (HOSPITAL_COMMUNITY): Payer: Self-pay

## 2024-05-15 ENCOUNTER — Encounter (HOSPITAL_COMMUNITY)

## 2024-05-17 ENCOUNTER — Other Ambulatory Visit (HOSPITAL_COMMUNITY): Payer: Self-pay | Admitting: Medical

## 2024-05-17 ENCOUNTER — Ambulatory Visit (HOSPITAL_COMMUNITY)
Admission: RE | Admit: 2024-05-17 | Discharge: 2024-05-17 | Disposition: A | Discharge status: Home / Self Care | Source: Ambulatory Visit | Attending: Vascular Surgery | Admitting: Vascular Surgery

## 2024-05-17 DIAGNOSIS — M7989 Other specified soft tissue disorders: Secondary | ICD-10-CM | POA: Diagnosis present

## 2024-05-17 DIAGNOSIS — M79604 Pain in right leg: Secondary | ICD-10-CM | POA: Diagnosis present
